# Patient Record
Sex: Male | Born: 1960 | Race: Black or African American | Hispanic: No | Marital: Single | Smoking: Never smoker
Health system: Southern US, Community
[De-identification: ages and names within clinical notes are randomized; demographics above are authoritative.]

## PROBLEM LIST (undated history)

## (undated) DIAGNOSIS — E039 Hypothyroidism, unspecified: Secondary | ICD-10-CM

## (undated) DIAGNOSIS — F79 Unspecified intellectual disabilities: Secondary | ICD-10-CM

## (undated) DIAGNOSIS — G40909 Epilepsy, unspecified, not intractable, without status epilepticus: Secondary | ICD-10-CM

---

## 2020-07-27 ENCOUNTER — Emergency Department (HOSPITAL_COMMUNITY)
Admission: EM | Admit: 2020-07-27 | Discharge: 2020-07-27 | Disposition: A | Discharge status: Home / Self Care | Payer: Medicaid Other | Attending: Emergency Medicine | Admitting: Emergency Medicine

## 2020-07-27 ENCOUNTER — Emergency Department (HOSPITAL_COMMUNITY): Payer: Medicaid Other

## 2020-07-27 ENCOUNTER — Other Ambulatory Visit: Payer: Self-pay

## 2020-07-27 ENCOUNTER — Encounter (HOSPITAL_COMMUNITY): Payer: Self-pay

## 2020-07-27 DIAGNOSIS — R519 Headache, unspecified: Secondary | ICD-10-CM | POA: Diagnosis not present

## 2020-07-27 DIAGNOSIS — M79652 Pain in left thigh: Secondary | ICD-10-CM | POA: Insufficient documentation

## 2020-07-27 DIAGNOSIS — M79642 Pain in left hand: Secondary | ICD-10-CM | POA: Diagnosis present

## 2020-07-27 DIAGNOSIS — W19XXXA Unspecified fall, initial encounter: Secondary | ICD-10-CM

## 2020-07-27 DIAGNOSIS — S8012XA Contusion of left lower leg, initial encounter: Secondary | ICD-10-CM

## 2020-07-27 DIAGNOSIS — S60012A Contusion of left thumb without damage to nail, initial encounter: Secondary | ICD-10-CM

## 2020-07-27 HISTORY — DX: Epilepsy, unspecified, not intractable, without status epilepticus: G40.909

## 2020-07-27 HISTORY — DX: Unspecified intellectual disabilities: F79

## 2020-07-27 HISTORY — DX: Hypothyroidism, unspecified: E03.9

## 2020-07-27 LAB — CBC WITH DIFFERENTIAL/PLATELET
Abs Immature Granulocytes: 0.01 10*3/uL (ref 0.00–0.07)
Basophils Absolute: 0 10*3/uL (ref 0.0–0.1)
Basophils Relative: 1 %
Eosinophils Absolute: 0.1 10*3/uL (ref 0.0–0.5)
Eosinophils Relative: 2 %
HCT: 34.7 % — ABNORMAL LOW (ref 39.0–52.0)
Hemoglobin: 11.3 g/dL — ABNORMAL LOW (ref 13.0–17.0)
Immature Granulocytes: 0 %
Lymphocytes Relative: 29 %
Lymphs Abs: 1.6 10*3/uL (ref 0.7–4.0)
MCH: 28.4 pg (ref 26.0–34.0)
MCHC: 32.6 g/dL (ref 30.0–36.0)
MCV: 87.2 fL (ref 80.0–100.0)
Monocytes Absolute: 0.7 10*3/uL (ref 0.1–1.0)
Monocytes Relative: 13 %
Neutro Abs: 3 10*3/uL (ref 1.7–7.7)
Neutrophils Relative %: 55 %
Platelets: 240 10*3/uL (ref 150–400)
RBC: 3.98 MIL/uL — ABNORMAL LOW (ref 4.22–5.81)
RDW: 14.7 % (ref 11.5–15.5)
WBC: 5.4 10*3/uL (ref 4.0–10.5)
nRBC: 0 % (ref 0.0–0.2)

## 2020-07-27 LAB — COMPREHENSIVE METABOLIC PANEL
ALT: 20 U/L (ref 0–44)
AST: 24 U/L (ref 15–41)
Albumin: 3.9 g/dL (ref 3.5–5.0)
Alkaline Phosphatase: 77 U/L (ref 38–126)
Anion gap: 7 (ref 5–15)
BUN: 14 mg/dL (ref 8–23)
CO2: 28 mmol/L (ref 22–32)
Calcium: 8.9 mg/dL (ref 8.9–10.3)
Chloride: 102 mmol/L (ref 98–111)
Creatinine, Ser: 0.75 mg/dL (ref 0.61–1.24)
GFR, Estimated: >60 mL/min (ref >60)
Glucose, Bld: 89 mg/dL (ref 70–99)
Potassium: 4 mmol/L (ref 3.5–5.1)
Sodium: 137 mmol/L (ref 135–145)
Total Bilirubin: 0.4 mg/dL (ref 0.3–1.2)
Total Protein: 7.1 g/dL (ref 6.5–8.1)

## 2020-07-27 LAB — URINALYSIS, ROUTINE W REFLEX MICROSCOPIC
Bilirubin Urine: NEGATIVE
Glucose, UA: NEGATIVE mg/dL
Hgb urine dipstick: NEGATIVE
Ketones, ur: NEGATIVE mg/dL
Leukocytes,Ua: NEGATIVE
Nitrite: NEGATIVE
Protein, ur: NEGATIVE mg/dL
Specific Gravity, Urine: 1.023 (ref 1.005–1.030)
pH: 6 (ref 5.0–8.0)

## 2020-07-27 LAB — PHENYTOIN LEVEL, TOTAL: Phenytoin Lvl: 27.3 ug/mL — ABNORMAL HIGH (ref 10.0–20.0)

## 2020-07-27 NOTE — ED Provider Notes (Signed)
MSE was initiated and I personally evaluated the patient and placed orders (if any) at  11:17 AM on July 27, 2020.  The patient appears stable so that the remainder of the MSE may be completed by another provider. Patient placed in Quick Look pathway, seen and evaluated   Chief Complaint: fall  HPI:   Pt fell last pm.  Pt less responsive now. Hand pain and leg pain  ROS: no fever no cough  Physical Exam:   Gen: No distress  Neuro: Awake and Alert  Skin: Warm    Focused Exam: WDWN 59yo nomal resp rate, normal heart rate    Initiation of care has begun. The patient has been counseled on the process, plan, and necessity for staying for the completion/evaluation, and the remainder of the medical screening examination    Ryan Vincent 07/27/20 1119    Vanetta Mulders, MD 07/27/20 [phone]

## 2020-07-27 NOTE — Discharge Instructions (Addendum)
Return if any problems.   Tylenol for pain  °

## 2020-07-27 NOTE — ED Triage Notes (Signed)
Increase in falls x 1.5 weeks, was seen by PCP on Wednesday, here with caregiver per caregiver last fall last night and woke up very unsteady. Last given tylenol ~830AM. Lives in assistant living. Mentation at baseline at this time per caregiver.

## 2020-08-13 NOTE — ED Provider Notes (Signed)
[facility] EMERGENCY DEPARTMENT Provider Note   CSN: [id number] Arrival date & time: 07/27/20  1015     History Chief Complaint  Patient presents with  . Weakness    Ryan Vincent is a 60 y.o. male.  The history is provided by the patient and a caregiver. No language interpreter was used.  Weakness Severity:  Moderate Onset quality:  Gradual Timing:  Constant Progression:  Worsening Chronicity:  New Relieved by:  Nothing Worsened by:  Nothing Ineffective treatments:  None tried      Past Medical History:  Diagnosis Date  . Hypothyroid   . Mental disability   . Seizure disorder (HCC)     There are no problems to display for this patient.   History reviewed. No pertinent surgical history.     History reviewed. No pertinent family history.  Social History   Tobacco Use  . Smoking status: Never Smoker  . Smokeless tobacco: Never Used  Substance Use Topics  . Alcohol use: Not Currently  . Drug use: Not Currently    Home Medications Prior to Admission medications   Medication Sig Start Date End Date Taking? Authorizing Provider  benztropine (COGENTIN) 1 MG tablet Take 1 mg by mouth 2 (two) times daily.   Yes [provider]  haloperidol (HALDOL) 10 MG tablet Take 10 mg by mouth 4 (four) times daily. 1.5 tablets at bedtime   Yes [provider]  hydrOXYzine (ATARAX/VISTARIL) 25 MG tablet Take 12.5 mg by mouth 3 (three) times daily as needed.   Yes [provider]  PARoxetine (PAXIL) 20 MG tablet Take by mouth daily.   Yes [provider]  phenytoin (DILANTIN) 100 MG ER capsule Take by mouth 3 (three) times daily.   Yes [provider]  phenytoin (DILANTIN) 30 MG ER capsule Take by mouth 3 (three) times daily.   Yes [provider]  primidone (MYSOLINE) 250 MG tablet Take 250 mg by mouth 4 (four) times daily.   Yes [provider]  traZODone (DESYREL) 100 MG tablet Take 100 mg by mouth at  bedtime. 2 tablets at bedtime   Yes [provider]    Allergies    Patient has no allergy information on record.  Review of Systems   Review of Systems  Neurological: Positive for weakness.  All other systems reviewed and are negative.   Physical Exam Updated Vital Signs BP 110/89 (BP Location: Right Arm)   Pulse 76   Temp 98 F (36.7 C) (Oral)   Resp 16   Ht 5\' 7"  (1.702 m)   Wt 77.5 kg   SpO2 98%   BMI 26.77 kg/m   Physical Exam Vitals reviewed.  Cardiovascular:     Rate and Rhythm: Normal rate.     Pulses: Normal pulses.  Pulmonary:     Effort: Pulmonary effort is normal.  Musculoskeletal:        General: Normal range of motion.  Skin:    General: Skin is warm.  Neurological:     General: No focal deficit present.     Mental Status: He is alert.  Psychiatric:        Mood and Affect: Mood normal.     ED Results / Procedures / Treatments   Labs (all labs ordered are listed, but only abnormal results are displayed) Labs Reviewed  CBC WITH DIFFERENTIAL/PLATELET - Abnormal; Notable for the following components:      Result Value   RBC 3.98 (*)  Hemoglobin 11.3 (*)    HCT 34.7 (*)    All other components within normal limits  PHENYTOIN LEVEL, TOTAL - Abnormal; Notable for the following components:   Phenytoin Lvl 27.3 (*)    All other components within normal limits  URINALYSIS, ROUTINE W REFLEX MICROSCOPIC  COMPREHENSIVE METABOLIC PANEL    EKG None  Radiology No results found.  Procedures Procedures   Medications Ordered in ED Medications - No data to display  ED Course  I have reviewed the triage vital signs and the nursing notes.  Pertinent labs & imaging results that were available during my care of the patient were reviewed by me and considered in my medical decision making (see chart for details).    MDM Rules/Calculators/A&P                          MDM: Xrays no fractures.  I advised follow up with primary care for  falls.  Final Clinical Impression(s) / ED Diagnoses Final diagnoses:  Fall, initial encounter  Contusion of left thumb without damage to nail, initial encounter  Contusion of left lower extremity, initial encounter    Rx / DC Orders ED Discharge Orders    None    An After Visit Summary was printed and given to the patient.    Elson Areas, [place] 08/13/20 1138    Vanetta Mulders, MD 08/20/20 [phone]

## 2020-10-03 ENCOUNTER — Emergency Department (HOSPITAL_COMMUNITY): Payer: Medicaid Other

## 2020-10-03 ENCOUNTER — Emergency Department (HOSPITAL_COMMUNITY)
Admission: EM | Admit: 2020-10-03 | Discharge: 2020-10-03 | Disposition: A | Discharge status: Home / Self Care | Payer: Medicaid Other | Attending: Emergency Medicine | Admitting: Emergency Medicine

## 2020-10-03 ENCOUNTER — Encounter (HOSPITAL_COMMUNITY): Payer: Self-pay | Admitting: *Deleted

## 2020-10-03 ENCOUNTER — Other Ambulatory Visit: Payer: Self-pay

## 2020-10-03 DIAGNOSIS — M25511 Pain in right shoulder: Secondary | ICD-10-CM | POA: Insufficient documentation

## 2020-10-03 DIAGNOSIS — W01190A Fall on same level from slipping, tripping and stumbling with subsequent striking against furniture, initial encounter: Secondary | ICD-10-CM | POA: Insufficient documentation

## 2020-10-03 DIAGNOSIS — S0990XA Unspecified injury of head, initial encounter: Secondary | ICD-10-CM | POA: Diagnosis present

## 2020-10-03 DIAGNOSIS — E039 Hypothyroidism, unspecified: Secondary | ICD-10-CM | POA: Diagnosis not present

## 2020-10-03 DIAGNOSIS — W19XXXA Unspecified fall, initial encounter: Secondary | ICD-10-CM

## 2020-10-03 DIAGNOSIS — S0181XA Laceration without foreign body of other part of head, initial encounter: Secondary | ICD-10-CM | POA: Insufficient documentation

## 2020-10-03 LAB — COMPREHENSIVE METABOLIC PANEL
ALT: 22 U/L (ref 0–44)
AST: 32 U/L (ref 15–41)
Albumin: 4.2 g/dL (ref 3.5–5.0)
Alkaline Phosphatase: 94 U/L (ref 38–126)
Anion gap: 6 (ref 5–15)
BUN: 16 mg/dL (ref 8–23)
CO2: 31 mmol/L (ref 22–32)
Calcium: 9.1 mg/dL (ref 8.9–10.3)
Chloride: 100 mmol/L (ref 98–111)
Creatinine, Ser: 0.76 mg/dL (ref 0.61–1.24)
GFR, Estimated: >60 mL/min (ref >60)
Glucose, Bld: 71 mg/dL (ref 70–99)
Potassium: 4 mmol/L (ref 3.5–5.1)
Sodium: 137 mmol/L (ref 135–145)
Total Bilirubin: 0.3 mg/dL (ref 0.3–1.2)
Total Protein: 7.7 g/dL (ref 6.5–8.1)

## 2020-10-03 LAB — TROPONIN I (HIGH SENSITIVITY)
Troponin I (High Sensitivity): 8 ng/L (ref <18)
Troponin I (High Sensitivity): 15 ng/L (ref <18)

## 2020-10-03 LAB — CBC WITH DIFFERENTIAL/PLATELET
Abs Immature Granulocytes: 0.01 10*3/uL (ref 0.00–0.07)
Basophils Absolute: 0.1 10*3/uL (ref 0.0–0.1)
Basophils Relative: 1 %
Eosinophils Absolute: 0 10*3/uL (ref 0.0–0.5)
Eosinophils Relative: 1 %
HCT: 37.8 % — ABNORMAL LOW (ref 39.0–52.0)
Hemoglobin: 12.4 g/dL — ABNORMAL LOW (ref 13.0–17.0)
Immature Granulocytes: 0 %
Lymphocytes Relative: 15 %
Lymphs Abs: 0.9 10*3/uL (ref 0.7–4.0)
MCH: 29.3 pg (ref 26.0–34.0)
MCHC: 32.8 g/dL (ref 30.0–36.0)
MCV: 89.4 fL (ref 80.0–100.0)
Monocytes Absolute: 0.7 10*3/uL (ref 0.1–1.0)
Monocytes Relative: 12 %
Neutro Abs: 4.1 10*3/uL (ref 1.7–7.7)
Neutrophils Relative %: 71 %
Platelets: 276 10*3/uL (ref 150–400)
RBC: 4.23 MIL/uL (ref 4.22–5.81)
RDW: 14.4 % (ref 11.5–15.5)
WBC: 5.7 10*3/uL (ref 4.0–10.5)
nRBC: 0 % (ref 0.0–0.2)

## 2020-10-03 LAB — PHENYTOIN LEVEL, TOTAL: Phenytoin Lvl: 22.8 ug/mL — ABNORMAL HIGH (ref 10.0–20.0)

## 2020-10-03 MED ORDER — LIDOCAINE-EPINEPHRINE-TETRACAINE (LET) TOPICAL GEL
3.0000 mL | Freq: Once | TOPICAL | Status: DC
  Filled 2020-10-03: qty 3

## 2020-10-03 MED ORDER — LIDOCAINE HCL (PF) 1 % IJ SOLN
5.0000 mL | Freq: Once | INTRAMUSCULAR | Status: AC
  Administered 2020-10-03: 5 mL via INTRADERMAL
  Filled 2020-10-03: qty 30

## 2020-10-03 MED ORDER — POVIDONE-IODINE 10 % EX SOLN
CUTANEOUS | Status: DC | PRN
  Filled 2020-10-03: qty 15

## 2020-10-03 NOTE — ED Triage Notes (Signed)
Pt brought in by [facility] with c/o fall this morning. Pt has laceration above upper lip on right side. Pt also c/o pain to upper right arm. Denies LOC. Staff reports pt hit his face on the knob of a coffee table when he fell.

## 2020-10-03 NOTE — Discharge Instructions (Addendum)
His x-rays, labs and CT today were reassuring.  Clean the laceration of his face with mild soap and water.  The stitch inside his mouth will dissolve on its own.  The sutures on the outside will need to be removed in 7 to 8 days.  You may give Tylenol or ibuprofen if needed for pain.  You may apply cool packs on and off to his face if needed for swelling.  Liquids and soft foods only for 7 days until the inside of his mouth heals.  Return to the emergency department for any signs of infection or worsening symptoms.

## 2020-10-03 NOTE — ED Provider Notes (Signed)
[facility] EMERGENCY DEPARTMENT Provider Note   CSN: [id number] Arrival date & time: 10/03/20  7829     History Chief Complaint  Patient presents with   Ryan Vincent is a 60 y.o. male.   Fall Pertinent negatives include no chest pain, no headaches and no shortness of breath.       Ryan Vincent is a 60 y.o. male who presents to the Emergency Department with caregiver from local group home for evaluation of fall and laceration of the right face.  Patient's caregiver states that he has been falling more frequently in the last several weeks his family and PCP are aware of his falls.  She states that he fell shortly before ER arrival and struck the right side of his face on a cabinet handle or coffee table.  Fall was unwitnessed.  Patient denies loss of consciousness but is poor historian due to his mental disability.  No reported seizure activity.  Patient complains of pain of his right shoulder.  Patient's caregiver states that he is not as talkative this morning as usual, but otherwise seems to be acting normally.  Patient denies any chest pain, abdominal pain, neck pain.  No reported nausea or vomiting.  He does not take any aspirin or anticoagulants.  No history of recent seizure or CVA.  Past Medical History:  Diagnosis Date   Hypothyroid    Mental disability    Moderate MR   Seizure disorder (HCC)     There are no problems to display for this patient.   History reviewed. No pertinent surgical history.     No family history on file.  Social History   Tobacco Use   Smoking status: Never   Smokeless tobacco: Never  Vaping Use   Vaping Use: Never used  Substance Use Topics   Alcohol use: Not Currently   Drug use: Not Currently    Home Medications Prior to Admission medications   Medication Sig Start Date End Date Taking? Authorizing Provider  benztropine (COGENTIN) 1 MG tablet Take 1 mg by mouth at bedtime.   Yes [provider]   haloperidol (HALDOL) 10 MG tablet Take 10 mg by mouth at bedtime.   Yes [provider]  hydrOXYzine (ATARAX/VISTARIL) 25 MG tablet Take 12.5 mg by mouth in the morning and at bedtime.   Yes [provider]  ibuprofen (ADVIL) 800 MG tablet Take 800 mg by mouth every 8 (eight) hours as needed for mild pain.   Yes [provider]  mirtazapine (REMERON) 30 MG tablet Take 30 mg by mouth at bedtime.   Yes [provider]  Multiple Vitamins-Minerals (GNP CENTURY ULTIMATE MENS) TABS Take 1 tablet by mouth daily. 09/16/20  Yes [provider]  PARoxetine (PAXIL) 20 MG tablet Take by mouth daily.   Yes [provider]  phenytoin (DILANTIN) 100 MG ER capsule Take 100 mg by mouth 3 (three) times daily.   Yes [provider]  phenytoin (DILANTIN) 30 MG ER capsule Take 30 mg by mouth at bedtime.   Yes [provider]  primidone (MYSOLINE) 250 MG tablet Take 250 mg by mouth daily.   Yes [provider]  zinc gluconate 50 MG tablet Take 50 mg by mouth daily.   Yes [provider]    Allergies    Patient has no known allergies.  Review of Systems   Review of Systems  Constitutional:  Negative for chills and fever.  HENT:  Negative for facial swelling, sore throat and trouble swallowing.        Right facial laceration  Eyes:  Negative for pain and visual disturbance.  Respiratory:  Negative for shortness of breath.   Cardiovascular:  Negative for chest pain.  Gastrointestinal:  Negative for nausea and vomiting.  Musculoskeletal:  Positive for arthralgias (right shoulder pain). Negative for back pain, myalgias, neck pain and neck stiffness.  Skin:  Negative for rash.  Neurological:  Negative for dizziness, syncope, weakness, numbness and headaches.  Hematological:  Does not bruise/bleed easily.   Physical Exam Updated Vital Signs BP 131/72   Pulse 70   Temp 97.9 F (36.6 C) (Oral)   Resp 13   Ht 5\' 8"  (1.727  m)   Wt 77.1 kg   SpO2 100%   BMI 25.85 kg/m   Physical Exam Vitals and nursing note reviewed.  Constitutional:      Appearance: Normal appearance. He is not ill-appearing.  HENT:     Head: Atraumatic.     Right Ear: Tympanic membrane and ear canal normal.     Left Ear: Tympanic membrane and ear canal normal.     Mouth/Throat:     Mouth: Mucous membranes are moist.     Pharynx: Oropharynx is clear. Uvula midline. No oropharyngeal exudate.     Comments: Patient has a 4 cm through and through laceration of the right face just superior to the lip.  No involvement of the vermilion border.  No edema or active bleeding.  No injury of the gums.  Patient is edentulous. Eyes:     Extraocular Movements: Extraocular movements intact.     Conjunctiva/sclera: Conjunctivae normal.     Pupils: Pupils are equal, round, and reactive to light.  Cardiovascular:     Rate and Rhythm: Normal rate and regular rhythm.     Pulses: Normal pulses.  Pulmonary:     Effort: Pulmonary effort is normal.     Breath sounds: Normal breath sounds.  Abdominal:     Palpations: Abdomen is soft.     Tenderness: There is no abdominal tenderness.  Musculoskeletal:        General: Tenderness and signs of injury present.     Cervical back: Full passive range of motion without pain and normal range of motion. No tenderness.     Comments: Tenderness palpation of the anterior right shoulder.  No edema or bony deformity.  Skin:    General: Skin is warm.     Capillary Refill: Capillary refill takes less than 2 seconds.  Neurological:     General: No focal deficit present.     Mental Status: He is alert.     Sensory: No sensory deficit.     Motor: No weakness.    ED Results / Procedures / Treatments   Labs (all labs ordered are listed, but only abnormal results are displayed) Labs Reviewed  CBC WITH DIFFERENTIAL/PLATELET - Abnormal; Notable for the following components:      Result Value   Hemoglobin 12.4 (*)     HCT 37.8 (*)    All other components within normal limits  PHENYTOIN LEVEL, TOTAL - Abnormal; Notable for the following components:   Phenytoin Lvl 22.8 (*)    All other components within normal limits  COMPREHENSIVE METABOLIC PANEL  TROPONIN I (HIGH SENSITIVITY)  TROPONIN I (HIGH SENSITIVITY)    EKG EKG Interpretation  Date/Time:  Friday October 03 2020 10:05:54 EDT Ventricular Rate:  69 PR Interval:  209 QRS  Duration: 93 QT Interval:  388 QTC Calculation: 416 R Axis:   66 Text Interpretation: Sinus rhythm Confirmed by Vanetta Mulders [phone]) on 10/03/2020 10:11:22 AM  Radiology DG Shoulder Right  Result Date: 10/03/2020 CLINICAL DATA:  60 year old male status post fall this morning. Pain. EXAM: RIGHT SHOULDER - 2+ VIEW COMPARISON:  None. FINDINGS: Bone mineralization is within normal limits for age. There is superior subluxation of the right humeral head but no glenohumeral dislocation. No other significant degenerative changes identified about the right shoulder. Negative visible right ribs and chest. IMPRESSION: 1. No acute fracture or dislocation identified about the right shoulder. 2. Superior positioning of the right humeral head such as can be seen with rotator cuff injury. Electronically Signed   By: Odessa Fleming M.D.   On: 10/03/2020 11:15   CT Head Wo Contrast  Result Date: 10/03/2020 CLINICAL DATA:  Fall. EXAM: CT HEAD WITHOUT CONTRAST CT MAXILLOFACIAL WITHOUT CONTRAST CT CERVICAL SPINE WITHOUT CONTRAST TECHNIQUE: Multidetector CT imaging of the head, cervical spine, and maxillofacial structures were performed using the standard protocol without intravenous contrast. Multiplanar CT image reconstructions of the cervical spine and maxillofacial structures were also generated. COMPARISON:  None. FINDINGS: CT HEAD FINDINGS Brain: No evidence of acute large vascular territory infarction, hemorrhage, hydrocephalus, extra-axial collection or mass lesion/mass effect. Vascular: Mild calcific  atherosclerosis. No hyperdense vessel identified. Skull: No acute fracture Other: No mastoid effusions. CT MAXILLOFACIAL FINDINGS Osseous: No evidence of acute fracture. Remote bilateral medial orbital wall fractures, similar to July 27, 2020 CT. Remote bilateral nasal bone fractures, similar to prior. There is a discrete hypodense expansile lesion involving the midline anterior maxilla which measures approximately 2.5 x 2.5 cm. There is associated thinning/dehiscence along the roof of the hard palate. The patient is edentulous. Orbits: Negative. No traumatic or inflammatory finding. Sinuses: Mild inferior maxillary sinus mucosal thickening bilaterally. No air-fluid levels. Soft tissues: Mild diffuse soft tissue stranding/edema, potentially representing anasarca. CT CERVICAL SPINE FINDINGS Alignment: Straightening without substantial sagittal subluxation. Mild levocurvature. Skull base and vertebrae: No evidence of acute fracture. Vertebral body heights are maintained. Soft tissues and spinal canal: No prevertebral fluid or swelling. No visible canal hematoma. Diffuse subcutaneous soft tissue stranding. Disc levels:  Mild multilevel degenerative change. Upper chest: Visualized lung apices are clear. IMPRESSION: CT head: No evidence of acute intracranial abnormality. CT maxillofacial: 1. No evidence of acute fracture. 2. Remote medial orbital wall and nasal bone fractures. 3. Large 2.5 cm hypodense, well-defined osseous lesion along the anterior maxilla, favored to represent an enlarged nasopalatine duct cyst or other residual cyst. CT cervical spine: 1. No evidence of acute fracture or traumatic malalignment. 2. Diffuse subcutaneous soft tissue stranding, potentially anasarca. Electronically Signed   By: Feliberto Harts MD   On: 10/03/2020 11:17   CT Cervical Spine Wo Contrast  Result Date: 10/03/2020 CLINICAL DATA:  Fall. EXAM: CT HEAD WITHOUT CONTRAST CT MAXILLOFACIAL WITHOUT CONTRAST CT CERVICAL SPINE  WITHOUT CONTRAST TECHNIQUE: Multidetector CT imaging of the head, cervical spine, and maxillofacial structures were performed using the standard protocol without intravenous contrast. Multiplanar CT image reconstructions of the cervical spine and maxillofacial structures were also generated. COMPARISON:  None. FINDINGS: CT HEAD FINDINGS Brain: No evidence of acute large vascular territory infarction, hemorrhage, hydrocephalus, extra-axial collection or mass lesion/mass effect. Vascular: Mild calcific atherosclerosis. No hyperdense vessel identified. Skull: No acute fracture Other: No mastoid effusions. CT MAXILLOFACIAL FINDINGS Osseous: No evidence of acute fracture. Remote bilateral medial orbital wall fractures, similar to July 27, 2020  CT. Remote bilateral nasal bone fractures, similar to prior. There is a discrete hypodense expansile lesion involving the midline anterior maxilla which measures approximately 2.5 x 2.5 cm. There is associated thinning/dehiscence along the roof of the hard palate. The patient is edentulous. Orbits: Negative. No traumatic or inflammatory finding. Sinuses: Mild inferior maxillary sinus mucosal thickening bilaterally. No air-fluid levels. Soft tissues: Mild diffuse soft tissue stranding/edema, potentially representing anasarca. CT CERVICAL SPINE FINDINGS Alignment: Straightening without substantial sagittal subluxation. Mild levocurvature. Skull base and vertebrae: No evidence of acute fracture. Vertebral body heights are maintained. Soft tissues and spinal canal: No prevertebral fluid or swelling. No visible canal hematoma. Diffuse subcutaneous soft tissue stranding. Disc levels:  Mild multilevel degenerative change. Upper chest: Visualized lung apices are clear. IMPRESSION: CT head: No evidence of acute intracranial abnormality. CT maxillofacial: 1. No evidence of acute fracture. 2. Remote medial orbital wall and nasal bone fractures. 3. Large 2.5 cm hypodense, well-defined osseous  lesion along the anterior maxilla, favored to represent an enlarged nasopalatine duct cyst or other residual cyst. CT cervical spine: 1. No evidence of acute fracture or traumatic malalignment. 2. Diffuse subcutaneous soft tissue stranding, potentially anasarca. Electronically Signed   By: Feliberto HartsFrederick S Jones MD   On: 10/03/2020 11:17   CT Maxillofacial Wo Contrast  Result Date: 10/03/2020 CLINICAL DATA:  Fall. EXAM: CT HEAD WITHOUT CONTRAST CT MAXILLOFACIAL WITHOUT CONTRAST CT CERVICAL SPINE WITHOUT CONTRAST TECHNIQUE: Multidetector CT imaging of the head, cervical spine, and maxillofacial structures were performed using the standard protocol without intravenous contrast. Multiplanar CT image reconstructions of the cervical spine and maxillofacial structures were also generated. COMPARISON:  None. FINDINGS: CT HEAD FINDINGS Brain: No evidence of acute large vascular territory infarction, hemorrhage, hydrocephalus, extra-axial collection or mass lesion/mass effect. Vascular: Mild calcific atherosclerosis. No hyperdense vessel identified. Skull: No acute fracture Other: No mastoid effusions. CT MAXILLOFACIAL FINDINGS Osseous: No evidence of acute fracture. Remote bilateral medial orbital wall fractures, similar to July 27, 2020 CT. Remote bilateral nasal bone fractures, similar to prior. There is a discrete hypodense expansile lesion involving the midline anterior maxilla which measures approximately 2.5 x 2.5 cm. There is associated thinning/dehiscence along the roof of the hard palate. The patient is edentulous. Orbits: Negative. No traumatic or inflammatory finding. Sinuses: Mild inferior maxillary sinus mucosal thickening bilaterally. No air-fluid levels. Soft tissues: Mild diffuse soft tissue stranding/edema, potentially representing anasarca. CT CERVICAL SPINE FINDINGS Alignment: Straightening without substantial sagittal subluxation. Mild levocurvature. Skull base and vertebrae: No evidence of acute  fracture. Vertebral body heights are maintained. Soft tissues and spinal canal: No prevertebral fluid or swelling. No visible canal hematoma. Diffuse subcutaneous soft tissue stranding. Disc levels:  Mild multilevel degenerative change. Upper chest: Visualized lung apices are clear. IMPRESSION: CT head: No evidence of acute intracranial abnormality. CT maxillofacial: 1. No evidence of acute fracture. 2. Remote medial orbital wall and nasal bone fractures. 3. Large 2.5 cm hypodense, well-defined osseous lesion along the anterior maxilla, favored to represent an enlarged nasopalatine duct cyst or other residual cyst. CT cervical spine: 1. No evidence of acute fracture or traumatic malalignment. 2. Diffuse subcutaneous soft tissue stranding, potentially anasarca. Electronically Signed   By: Feliberto HartsFrederick S Jones MD   On: 10/03/2020 11:17    Procedures Procedures    LACERATION REPAIR Performed by: Nashya Garlington Authorized by: Dakari Stabler Consent: Verbal consent obtained. Risks and benefits: risks, benefits and alternatives were discussed Consent given by: patient Patient identity confirmed: provided demographic data Prepped and Draped in normal sterile  fashion Wound explored  Laceration Location: right face  Laceration Length: 4 cm  No Foreign Bodies seen or palpated  Anesthesia: local infiltration  Local anesthetic: lidocaine 1% w/epinephrine  Anesthetic total: 3 ml  Irrigation method: syringe Amount of cleaning: standard  Skin closure: 5-0 Ethilon  Number of sutures: 6  Technique: simple interrupted to the skin  Technique:  one simple interrupted suture of 5-0 vicryl to the oral mucosa   Patient tolerance: Patient somewhat tolerated the procedure,  with no immediate complications.  Medications Ordered in ED Medications  povidone-iodine (BETADINE) 10 % external solution (has no administration in time range)  lidocaine-EPINEPHrine-tetracaine (LET) topical gel (has no  administration in time range)  lidocaine (PF) (XYLOCAINE) 1 % injection 5 mL (5 mLs Intradermal Given 10/03/20 1341)    ED Course  I have reviewed the triage vital signs and the nursing notes.  Pertinent labs & imaging results that were available during my care of the patient were reviewed by me and considered in my medical decision making (see chart for details).    MDM Rules/Calculators/A&P                          Patient here from group home accompanied by caregiver.  Here for evaluation of mechanical fall that was unwitnessed.  No seizure activity reported.  He sustained a facial laceration from the fall.  Through and through laceration of the right face.  Bleeding controlled prior to arrival.  Dental injury as patient is edentulous.  No malocclusion.  CT imaging of the cervical spine, maxillofacial and head were all reassuring.  No acute bony findings of the right shoulder.  Caregiver reports patient is at his neurologic baseline.  Wound was cleaned and edges well approximated.  Closure of the oral mucosa was limited due to patient's lack of tolerance to the procedure.  Patient and caregiver agreed to wound care instructions and diet consisting of soft foods and liquids until wounds heal.  Skin sutures out in 5 to 7 days.  Orders prescribed     Final Clinical Impression(s) / ED Diagnoses Final diagnoses:  Fall, initial encounter  Facial laceration, initial encounter    Rx / DC Orders ED Discharge Orders     None        Pauline Aus, PA-C 10/07/20 0925    Vanetta Mulders, MD 10/09/20 [phone]

## 2021-01-26 ENCOUNTER — Encounter (HOSPITAL_COMMUNITY): Payer: Self-pay | Admitting: *Deleted

## 2021-01-26 ENCOUNTER — Emergency Department (HOSPITAL_COMMUNITY)
Admission: EM | Admit: 2021-01-26 | Discharge: 2021-01-26 | Disposition: A | Discharge status: Home / Self Care | Payer: Medicaid Other | Attending: Emergency Medicine | Admitting: Emergency Medicine

## 2021-01-26 ENCOUNTER — Other Ambulatory Visit: Payer: Self-pay

## 2021-01-26 DIAGNOSIS — S0990XA Unspecified injury of head, initial encounter: Secondary | ICD-10-CM | POA: Insufficient documentation

## 2021-01-26 DIAGNOSIS — S0121XA Laceration without foreign body of nose, initial encounter: Secondary | ICD-10-CM | POA: Diagnosis present

## 2021-01-26 DIAGNOSIS — E039 Hypothyroidism, unspecified: Secondary | ICD-10-CM | POA: Diagnosis not present

## 2021-01-26 DIAGNOSIS — W01190A Fall on same level from slipping, tripping and stumbling with subsequent striking against furniture, initial encounter: Secondary | ICD-10-CM | POA: Insufficient documentation

## 2021-01-26 MED ORDER — LIDOCAINE-EPINEPHRINE (PF) 2 %-1:200000 IJ SOLN
20.0000 mL | Freq: Once | INTRAMUSCULAR | Status: AC
  Administered 2021-01-26: 20 mL
  Filled 2021-01-26: qty 20

## 2021-01-26 NOTE — ED Provider Notes (Signed)
[facility] EMERGENCY DEPARTMENT Provider Note   CSN: [id number] Arrival date & time: 01/26/21  1757     History Chief Complaint  Patient presents with   Fall    Lac     Ryan Vincent is a 60 y.o. male.  He is brought in by his caregiver is giving his history.  She said he was getting up from a sitting position and lost his balance falling forward and striking his head on the TV stand.  Laceration across bridge of nose.  Patient denies any other complaints.  Caregiver says he is otherwise at his baseline.  She does not know when his last tetanus shot was.  The history is provided by a caregiver.  Fall This is a recurrent problem. The current episode started 6 to 12 hours ago. The problem has not changed since onset.Pertinent negatives include no chest pain, no abdominal pain, no headaches and no shortness of breath. Nothing aggravates the symptoms. Nothing relieves the symptoms. He has tried nothing for the symptoms. The treatment provided no relief.      Past Medical History:  Diagnosis Date   Hypothyroid    Mental disability    Moderate MR   Seizure disorder (HCC)     There are no problems to display for this patient.   History reviewed. No pertinent surgical history.     History reviewed. No pertinent family history.  Social History   Tobacco Use   Smoking status: Never   Smokeless tobacco: Never  Vaping Use   Vaping Use: Never used  Substance Use Topics   Alcohol use: Not Currently   Drug use: Not Currently    Home Medications Prior to Admission medications   Medication Sig Start Date End Date Taking? Authorizing Provider  benztropine (COGENTIN) 1 MG tablet Take 1 mg by mouth at bedtime.    [provider]  haloperidol (HALDOL) 10 MG tablet Take 10 mg by mouth at bedtime.    [provider]  hydrOXYzine (ATARAX/VISTARIL) 25 MG tablet Take 12.5 mg by mouth in the morning and at bedtime.    [provider]  ibuprofen (ADVIL)  800 MG tablet Take 800 mg by mouth every 8 (eight) hours as needed for mild pain.    [provider]  mirtazapine (REMERON) 30 MG tablet Take 30 mg by mouth at bedtime.    [provider]  Multiple Vitamins-Minerals (GNP CENTURY ULTIMATE MENS) TABS Take 1 tablet by mouth daily. 09/16/20   [provider]  PARoxetine (PAXIL) 20 MG tablet Take by mouth daily.    [provider]  phenytoin (DILANTIN) 100 MG ER capsule Take 100 mg by mouth 3 (three) times daily.    [provider]  phenytoin (DILANTIN) 30 MG ER capsule Take 30 mg by mouth at bedtime.    [provider]  primidone (MYSOLINE) 250 MG tablet Take 250 mg by mouth daily.    [provider]  zinc gluconate 50 MG tablet Take 50 mg by mouth daily.    [provider]    Allergies    Patient has no known allergies.  Review of Systems   Review of Systems  Constitutional:  Negative for fever.  Respiratory:  Negative for shortness of breath.   Cardiovascular:  Negative for chest pain.  Gastrointestinal:  Negative for abdominal pain.  Neurological:  Negative for headaches.   Physical Exam Updated Vital Signs BP 125/70   Pulse 64   Temp 98.1 F (36.7  C) (Oral)   Resp 18   Ht 5\' 8"  (1.727 m)   Wt 77.1 kg   SpO2 100%   BMI 25.84 kg/m   Physical Exam Vitals and nursing note reviewed.  Constitutional:      Appearance: Normal appearance. He is well-developed.  HENT:     Head: Normocephalic.     Comments: He has 3 cm laceration across the bridge of his nose.  No facial tenderness otherwise.  Neck is nontender.  No step-offs or crepitus Eyes:     Conjunctiva/sclera: Conjunctivae normal.  Cardiovascular:     Rate and Rhythm: Normal rate and regular rhythm.  Pulmonary:     Effort: Pulmonary effort is normal.  Abdominal:     Tenderness: There is no abdominal tenderness. There is no guarding.  Musculoskeletal:     Cervical back: Neck supple.  Skin:     General: Skin is warm and dry.  Neurological:     General: No focal deficit present.     Mental Status: He is alert.     GCS: GCS eye subscore is 4. GCS verbal subscore is 5. GCS motor subscore is 6.    ED Results / Procedures / Treatments   Labs (all labs ordered are listed, but only abnormal results are displayed) Labs Reviewed - No data to display  EKG None  Radiology No results found.  Procedures . Laceration Repair  Date/Time: 01/26/2021 10:40 PM Performed by: 01/28/2021, MD Authorized by: Terrilee Files, MD   Consent:    Consent obtained:  Verbal   Consent given by:  Patient   Risks discussed:  Infection, pain, poor cosmetic result, poor wound healing and retained foreign body   Alternatives discussed:  No treatment and delayed treatment Anesthesia:    Anesthesia method:  Local infiltration   Local anesthetic:  Lidocaine 2% WITH epi Laceration details:    Location:  Face   Face location:  Nose   Length (cm):  3 Pre-procedure details:    Preparation:  Patient was prepped and draped in usual sterile fashion Treatment:    Area cleansed with:  Saline   Amount of cleaning:  Standard   Irrigation solution:  Sterile saline Skin repair:    Repair method:  Sutures   Suture size:  5-0   Suture material:  Prolene   Suture technique:  Simple interrupted   Number of sutures:  3 Approximation:    Approximation:  Close Repair type:    Repair type:  Simple Post-procedure details:    Dressing:  Open (no dressing)   Procedure completion:  Tolerated well, no immediate complications   Medications Ordered in ED Medications  lidocaine-EPINEPHrine (XYLOCAINE W/EPI) 2 %-1:200000 (PF) injection 20 mL (has no administration in time range)    ED Course  I have reviewed the triage vital signs and the nursing notes.  Pertinent labs & imaging results that were available during my care of the patient were reviewed by me and considered in my medical decision making  (see chart for details).    MDM Rules/Calculators/A&P                            Final Clinical Impression(s) / ED Diagnoses Final diagnoses:  Laceration of nose, initial encounter  Injury of head, initial encounter    Rx / DC Orders ED Discharge Orders     None        Terrilee Files, MD 01/27/21 1020

## 2021-01-26 NOTE — ED Triage Notes (Signed)
Pt from [facility] in [place], lost his balance and fell striking his face on TV stand.  Lac noted to bridge of the nose between eyes.  Caregiver at side and denies any LOC.  Pt denies pain.

## 2021-01-26 NOTE — Discharge Instructions (Signed)
You were seen in the emergency department for evaluation of a laceration to your nose.  This was sutured and these will need to be removed in 5 to 7 days.  Follow-up with your doctor.  Return to the emergency department if any signs of infection or other concerns.

## 2022-06-04 DEATH — deceased

## 2023-05-05 IMAGING — DX DG SHOULDER 2+V*R*
2 series · 3 of 3 positions shown · non-contrast
Comparison: None.

CLINICAL DATA: 59-year-old male status post fall this morning.
Pain.

EXAM:
RIGHT SHOULDER - 2+ VIEW

[shoulder grashey]
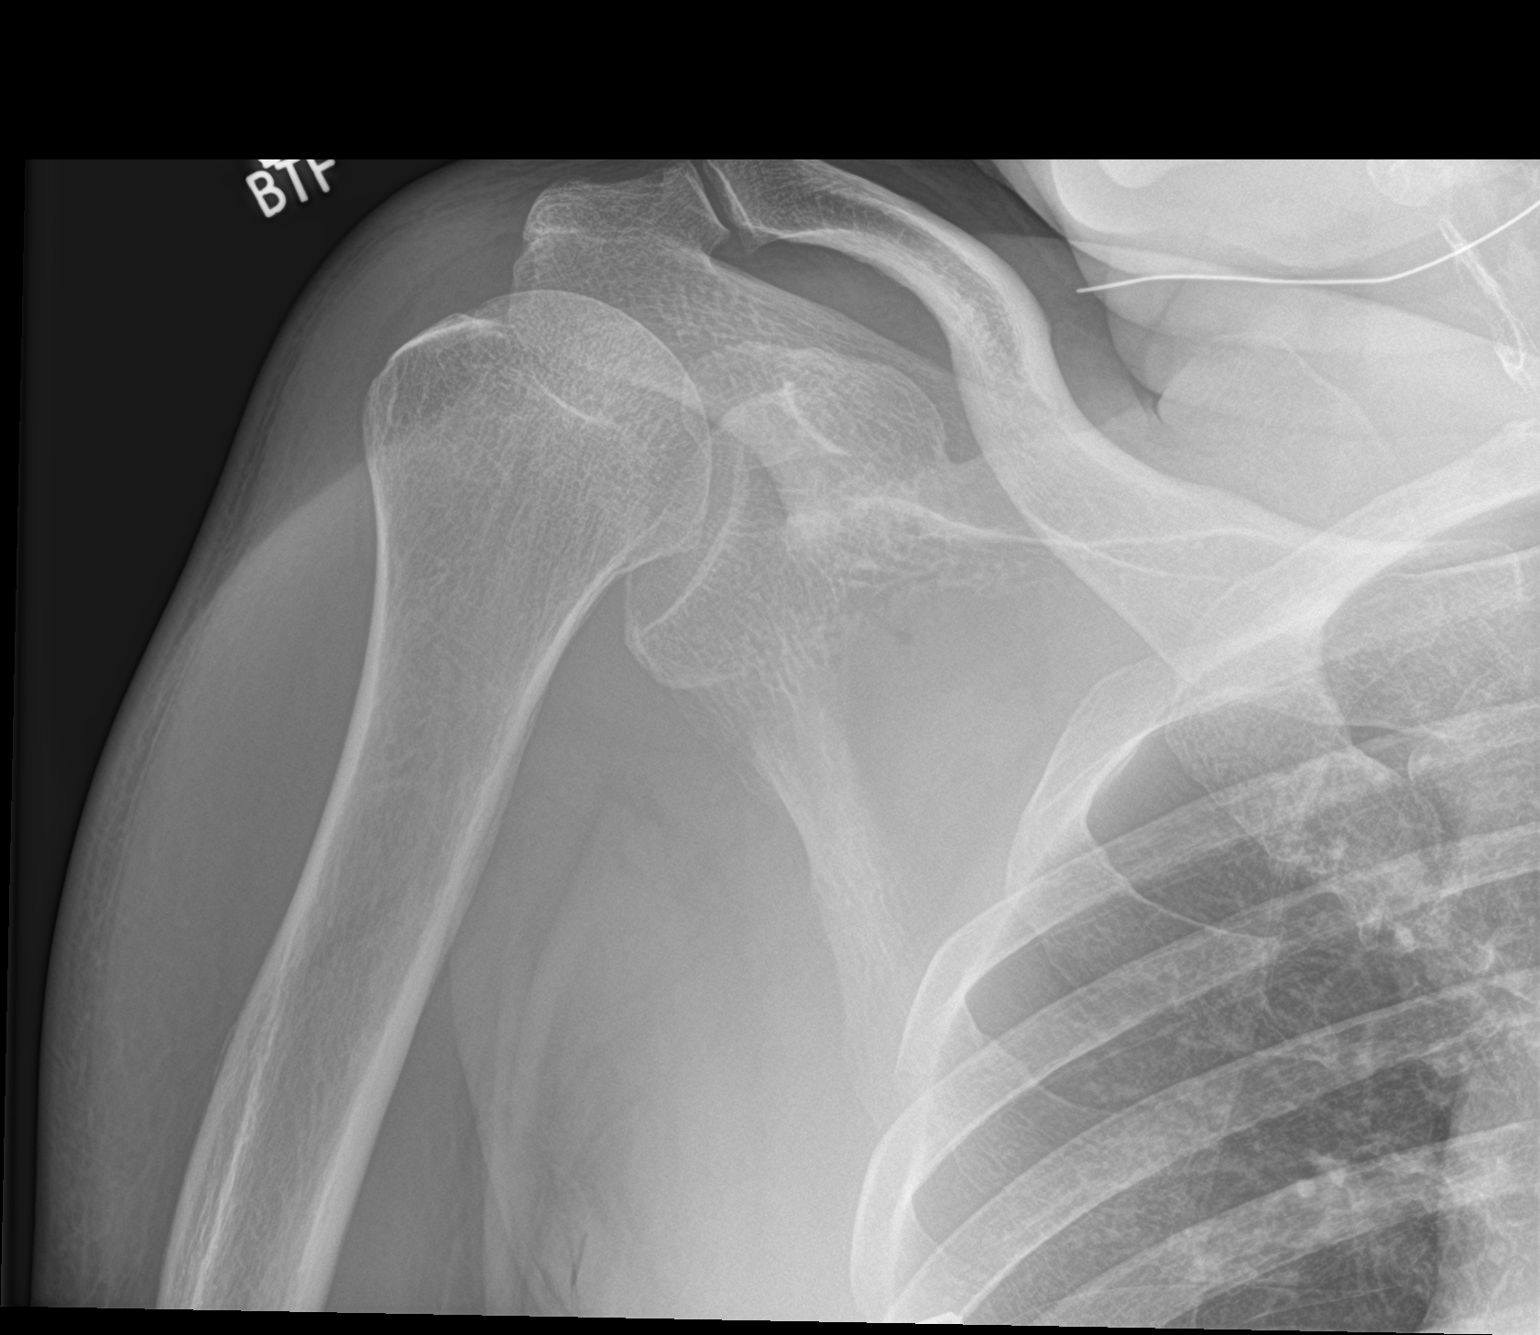

[Series 2: shoulder y view · 0.14mm/px · 2 of 2 slices shown]
[im 1/2]
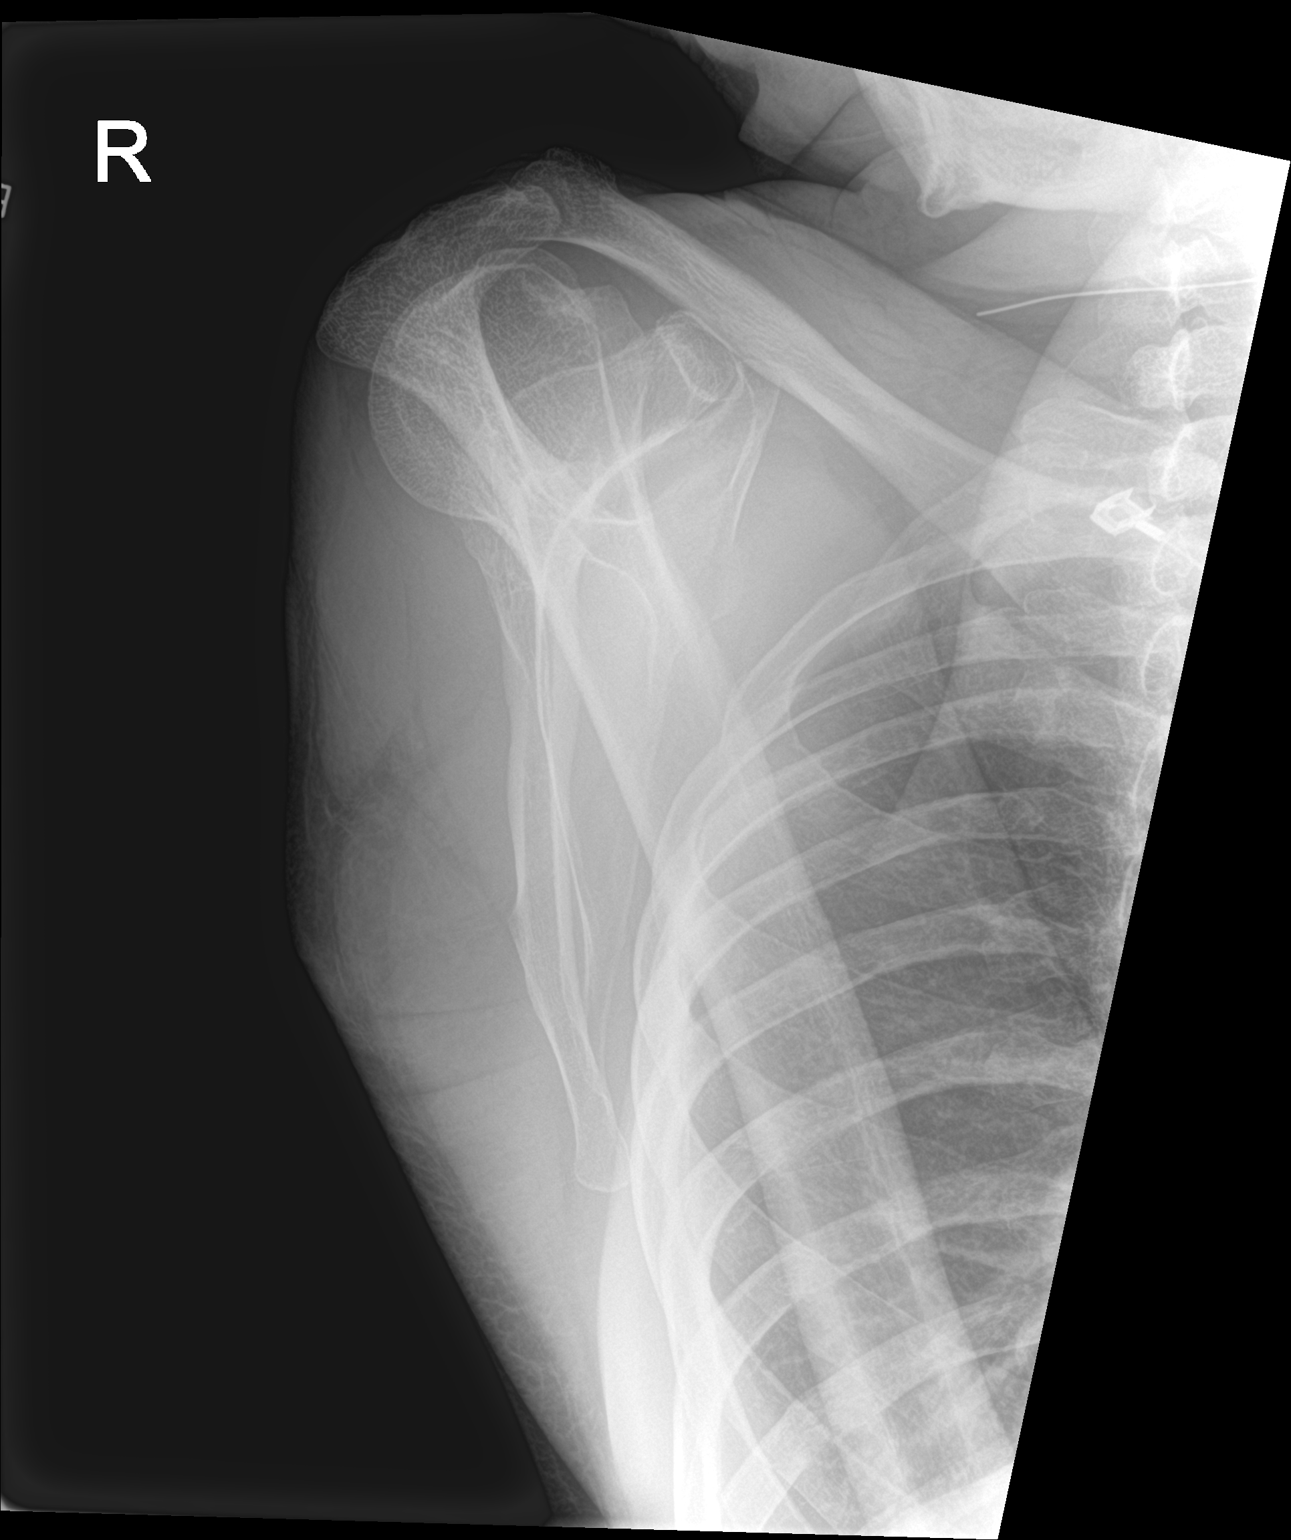
[im 2/2]
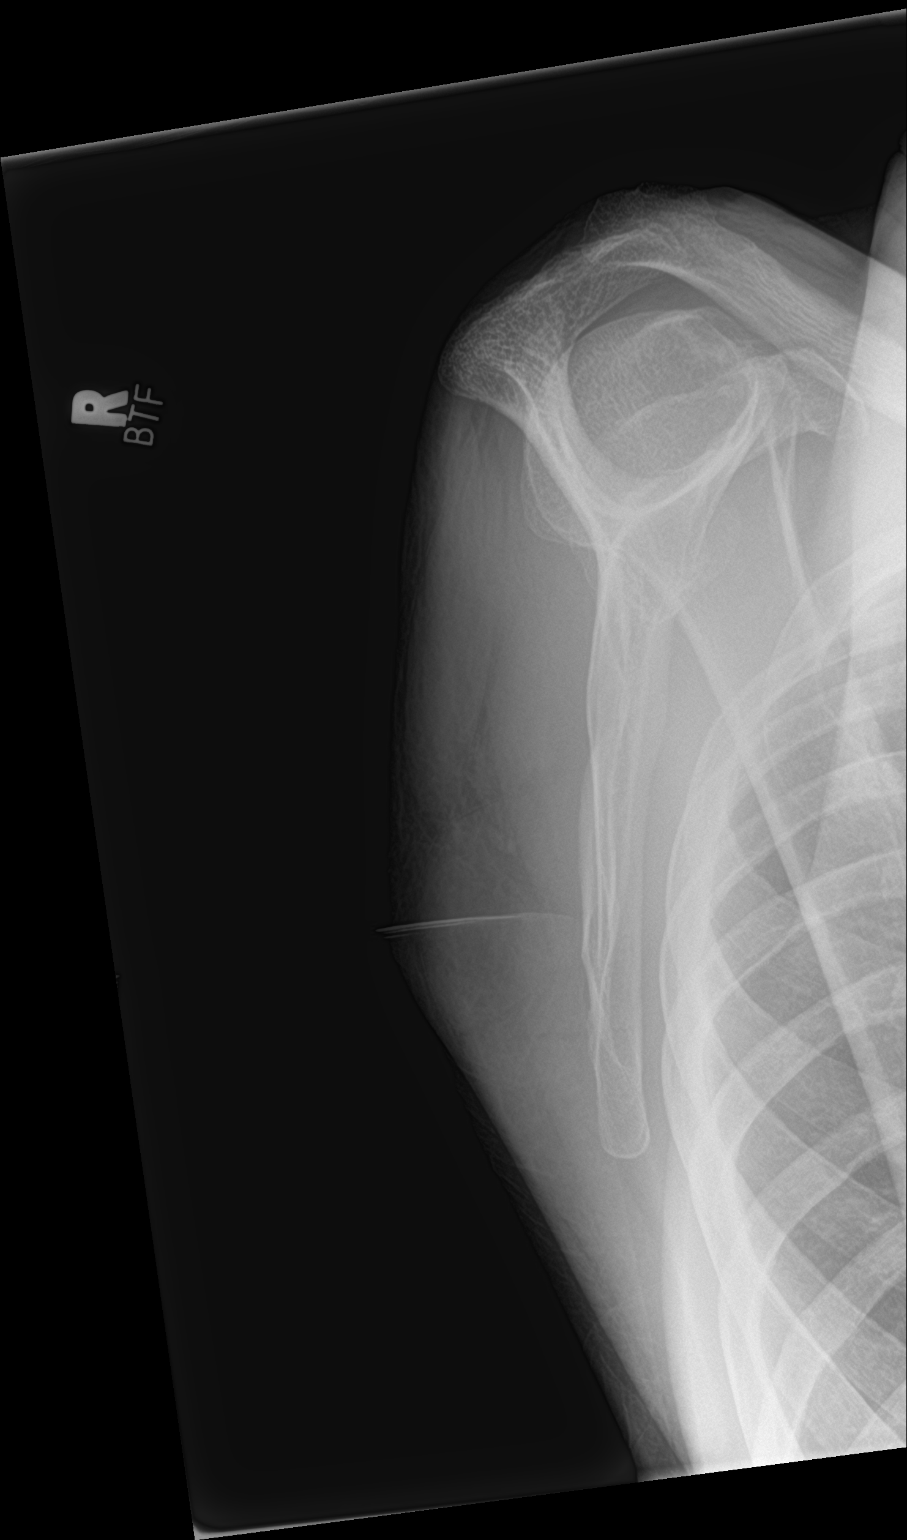

[3 of 3 positions shown; findings below may reference images not displayed]

FINDINGS: Bone mineralization is within normal limits for age. There is
superior subluxation of the right humeral head but no glenohumeral
dislocation. No other significant degenerative changes identified
about the right shoulder. Negative visible right ribs and chest.
IMPRESSION: 1. No acute fracture or dislocation identified about the right
shoulder.
2. Superior positioning of the right humeral head such as can be
seen with rotator cuff injury.

## 2023-05-05 IMAGING — CT CT CERVICAL SPINE W/O CM
3 of 4 series · 12 of 34 positions shown, 14 images · non-contrast
Comparison: None.

CLINICAL DATA: Fall.

EXAM:
CT HEAD WITHOUT CONTRAST
CT MAXILLOFACIAL WITHOUT CONTRAST
CT CERVICAL SPINE WITHOUT CONTRAST
TECHNIQUE: Multidetector CT imaging of the head, cervical spine, and
maxillofacial structures were performed using the standard protocol
without intravenous contrast. Multiplanar CT image reconstructions
of the cervical spine and maxillofacial structures were also
generated.

[Series 5: sag bone · sagittal · 0.28mm/px · 5 of 66 slices shown, 6 images]
[im 22/66  bone]
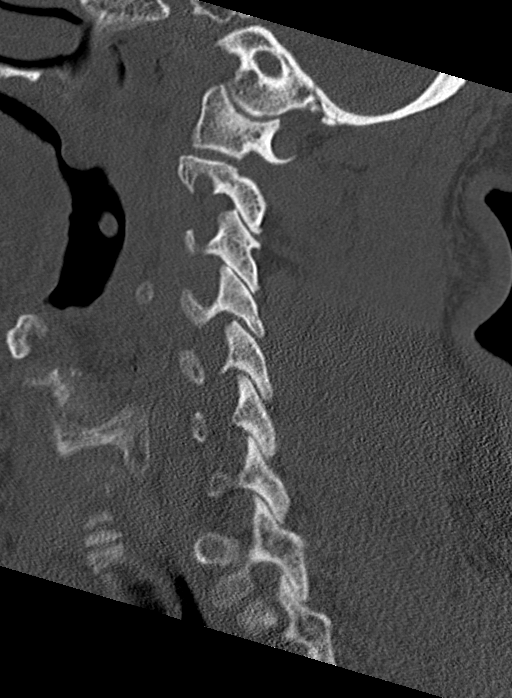
[im 28/66  bone]
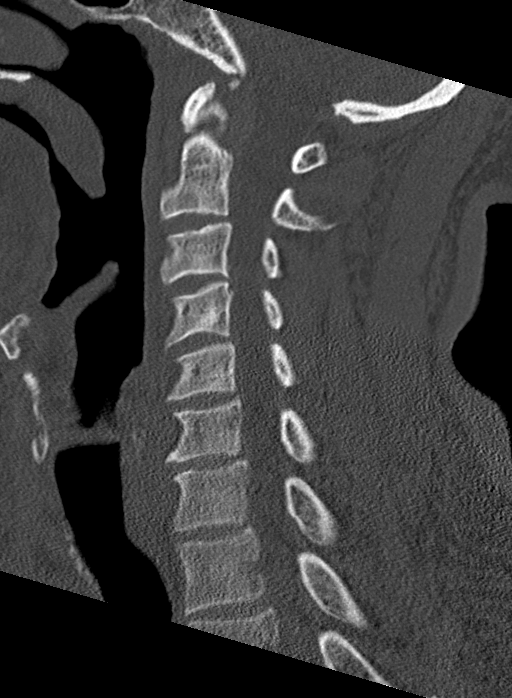
[im 33/66  soft-tissue]
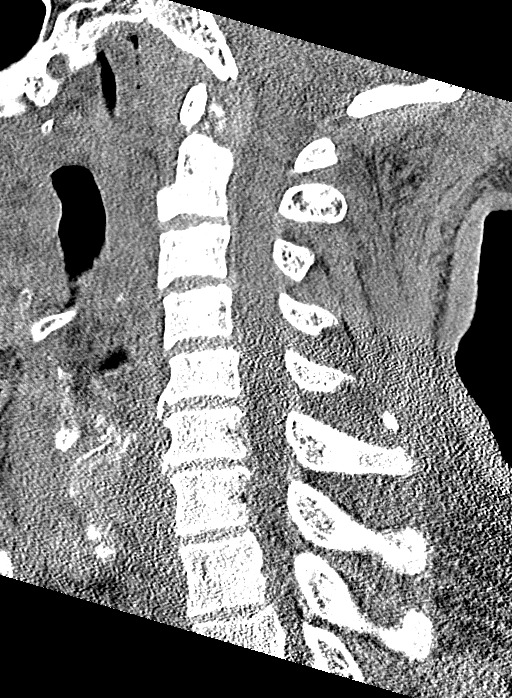
[im 33/66  bone]
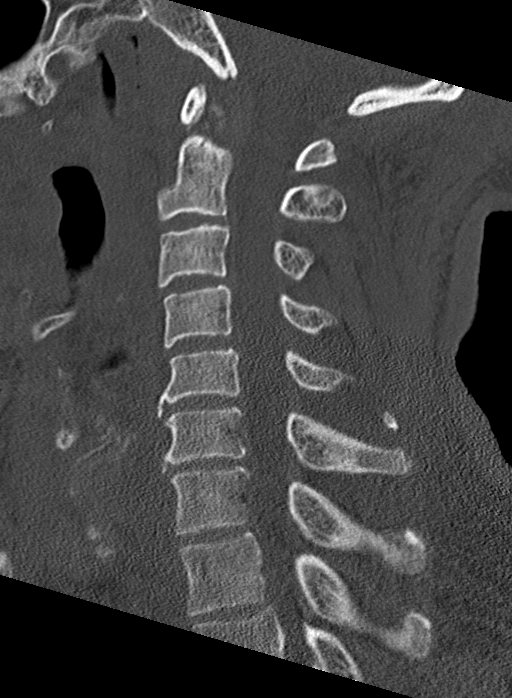
[im 38/66  bone]
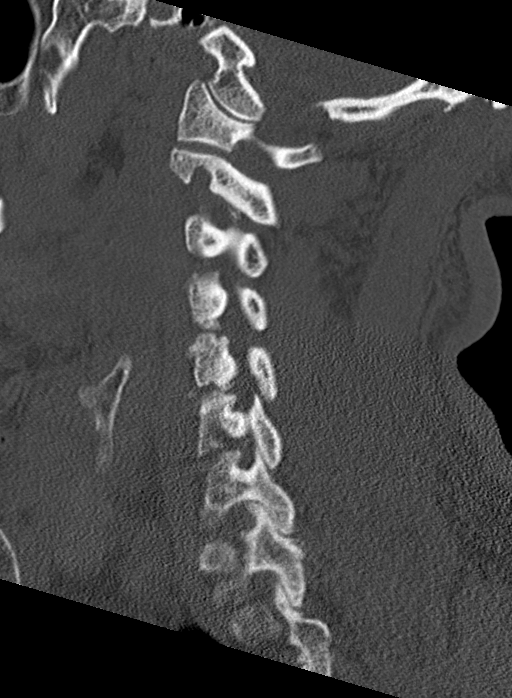
[im 44/66  bone]
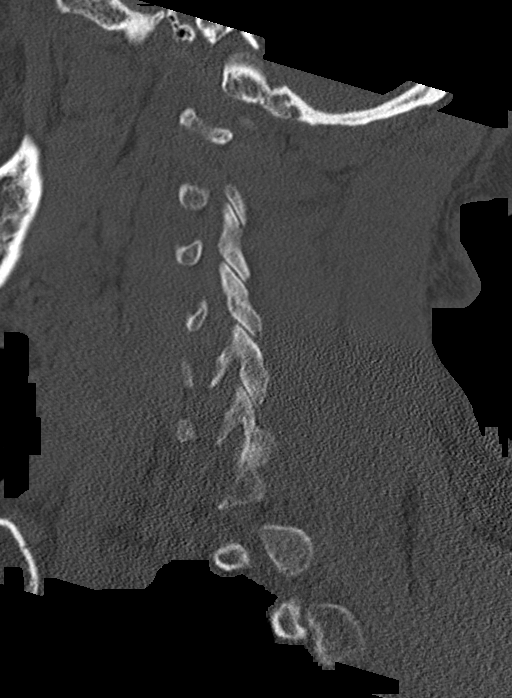

[Series 6: cor bone · coronal · 0.26mm/px · 3 of 72 slices shown]
[im 15/72  bone]
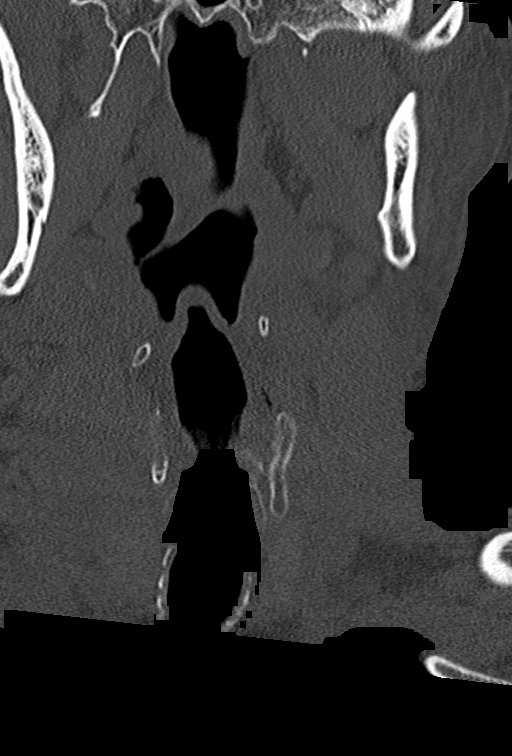
[im 29/72  bone]
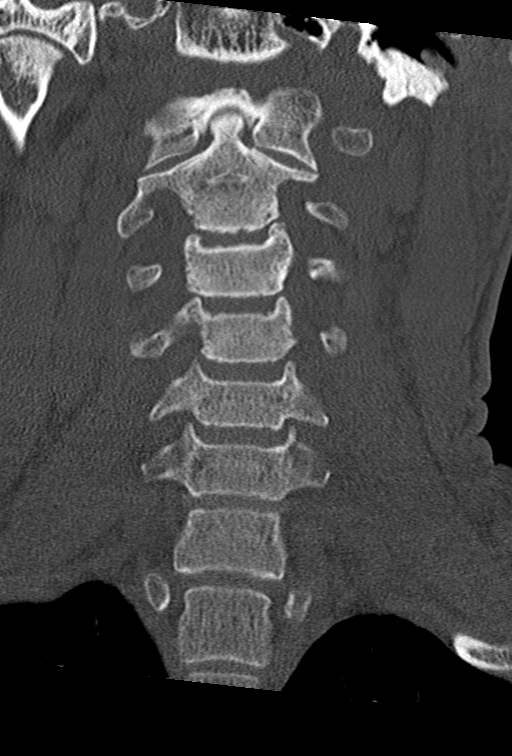
[im 43/72  bone]
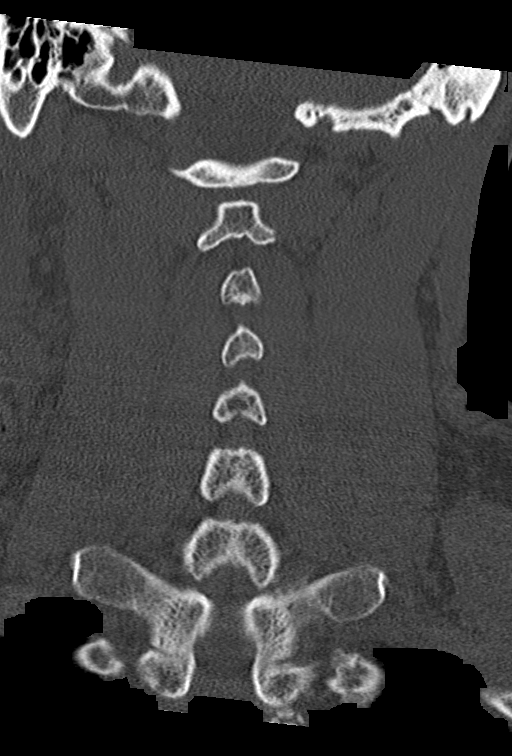

[Series 7: orthogonal axials · axial · 0.21mm/px · z∈[+1558,+1656]mm · 4 of 88 slices shown, 5 images]
[im 15/88  soft-tissue]
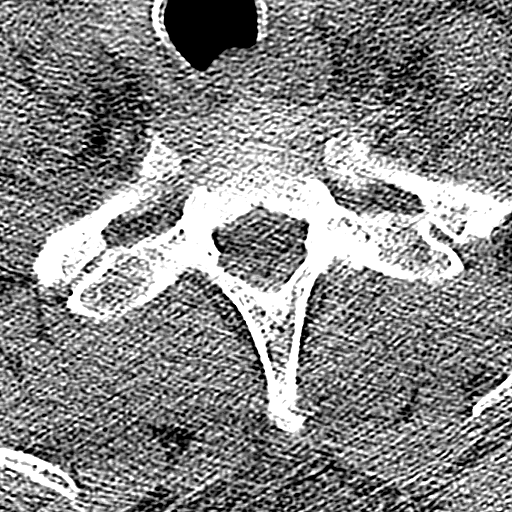
[im 15/88  bone]
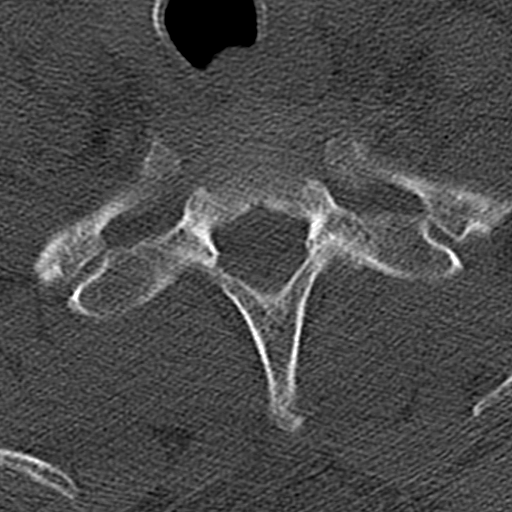
[im 30/88  bone]
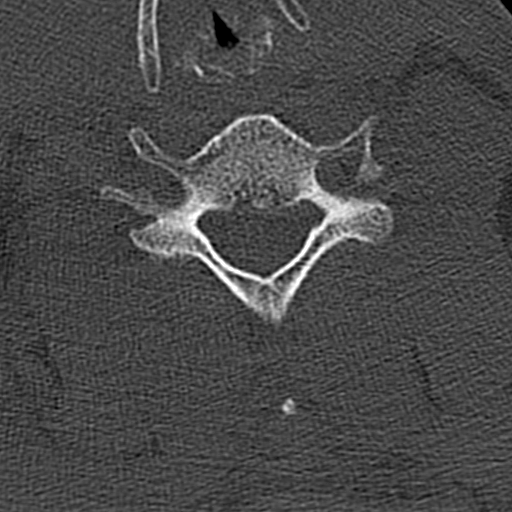
[im 59/88  bone]
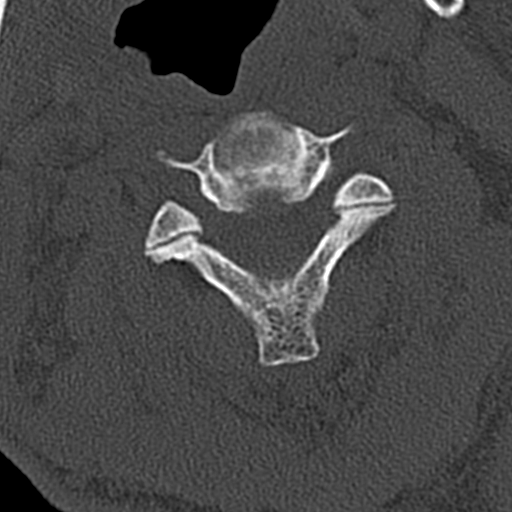
[im 73/88  bone]
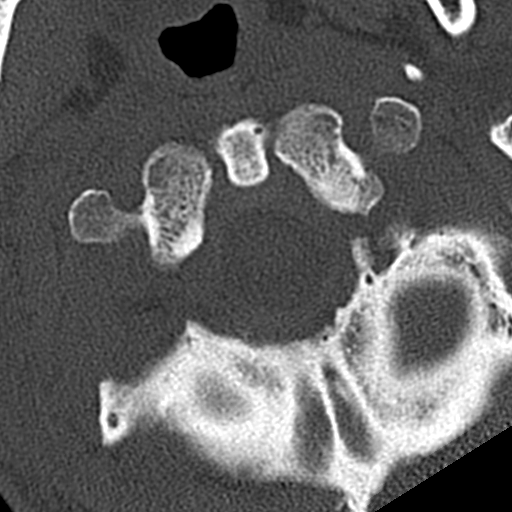

[12 of 34 positions shown; findings below may reference images not displayed]

FINDINGS: CT HEAD FINDINGS

Brain: No evidence of acute large vascular territory infarction,
hemorrhage, hydrocephalus, extra-axial collection or mass
lesion/mass effect.

Vascular: Mild calcific atherosclerosis. No hyperdense vessel
identified.

Skull: No acute fracture

Other: No mastoid effusions.

CT MAXILLOFACIAL FINDINGS

Osseous: No evidence of acute fracture. Remote bilateral medial
orbital wall fractures, similar to July 27, 2020 CT. Remote
bilateral nasal bone fractures, similar to prior. There is a
discrete hypodense expansile lesion involving the midline anterior
maxilla which measures approximately 2.5 x 2.5 cm. There is
associated thinning/dehiscence along the roof of the hard palate.
The patient is edentulous.

Orbits: Negative. No traumatic or inflammatory finding.

Sinuses: Mild inferior maxillary sinus mucosal thickening
bilaterally. No air-fluid levels.

Soft tissues: Mild diffuse soft tissue stranding/edema, potentially
representing anasarca.

CT CERVICAL SPINE FINDINGS

Alignment: Straightening without substantial sagittal subluxation.
Mild levocurvature.

Skull base and vertebrae: No evidence of acute fracture. Vertebral
body heights are maintained.

Soft tissues and spinal canal: No prevertebral fluid or swelling. No
visible canal hematoma. Diffuse subcutaneous soft tissue stranding.

Disc levels:  Mild multilevel degenerative change.

Upper chest: Visualized lung apices are clear.
IMPRESSION: CT head:

No evidence of acute intracranial abnormality.

CT maxillofacial:

1. No evidence of acute fracture.
2. Remote medial orbital wall and nasal bone fractures.
3. Large 2.5 cm hypodense, well-defined osseous lesion along the
anterior maxilla, favored to represent an enlarged nasopalatine duct
cyst or other residual cyst.

CT cervical spine:

1. No evidence of acute fracture or traumatic malalignment.
2. Diffuse subcutaneous soft tissue stranding, potentially anasarca.
